# Patient Record
Sex: Female | Born: 1985 | Race: Black or African American | Hispanic: No | Marital: Single | Smoking: Never smoker
Health system: Southern US, Community
[De-identification: ages and names within clinical notes are randomized; demographics above are authoritative.]

---

## 2019-09-27 ENCOUNTER — Emergency Department (HOSPITAL_COMMUNITY)

## 2019-09-27 ENCOUNTER — Emergency Department (HOSPITAL_COMMUNITY)
Admission: EM | Admit: 2019-09-27 | Discharge: 2019-09-27 | Disposition: A | Discharge status: Home / Self Care | Attending: Emergency Medicine | Admitting: Emergency Medicine

## 2019-09-27 ENCOUNTER — Other Ambulatory Visit: Payer: Self-pay

## 2019-09-27 ENCOUNTER — Encounter (HOSPITAL_COMMUNITY): Payer: Self-pay | Admitting: Emergency Medicine

## 2019-09-27 DIAGNOSIS — M791 Myalgia, unspecified site: Secondary | ICD-10-CM

## 2019-09-27 DIAGNOSIS — R519 Headache, unspecified: Secondary | ICD-10-CM | POA: Insufficient documentation

## 2019-09-27 DIAGNOSIS — Y999 Unspecified external cause status: Secondary | ICD-10-CM | POA: Diagnosis not present

## 2019-09-27 DIAGNOSIS — R0789 Other chest pain: Secondary | ICD-10-CM | POA: Diagnosis not present

## 2019-09-27 DIAGNOSIS — Y92410 Unspecified street and highway as the place of occurrence of the external cause: Secondary | ICD-10-CM | POA: Diagnosis not present

## 2019-09-27 DIAGNOSIS — M7918 Myalgia, other site: Secondary | ICD-10-CM | POA: Diagnosis not present

## 2019-09-27 DIAGNOSIS — Z79899 Other long term (current) drug therapy: Secondary | ICD-10-CM | POA: Insufficient documentation

## 2019-09-27 DIAGNOSIS — Y939 Activity, unspecified: Secondary | ICD-10-CM | POA: Insufficient documentation

## 2019-09-27 LAB — CBC WITH DIFFERENTIAL/PLATELET
Abs Immature Granulocytes: 0.04 10*3/uL (ref 0.00–0.07)
Basophils Absolute: 0 10*3/uL (ref 0.0–0.1)
Basophils Relative: 0 %
Eosinophils Absolute: 0 10*3/uL (ref 0.0–0.5)
Eosinophils Relative: 0 %
HCT: 46.4 % — ABNORMAL HIGH (ref 36.0–46.0)
Hemoglobin: 14.4 g/dL (ref 12.0–15.0)
Immature Granulocytes: 1 %
Lymphocytes Relative: 45 %
Lymphs Abs: 3.7 10*3/uL (ref 0.7–4.0)
MCH: 26 pg (ref 26.0–34.0)
MCHC: 31 g/dL (ref 30.0–36.0)
MCV: 83.8 fL (ref 80.0–100.0)
Monocytes Absolute: 0.4 10*3/uL (ref 0.1–1.0)
Monocytes Relative: 5 %
Neutro Abs: 4.1 10*3/uL (ref 1.7–7.7)
Neutrophils Relative %: 49 %
Platelets: 393 10*3/uL (ref 150–400)
RBC: 5.54 MIL/uL — ABNORMAL HIGH (ref 3.87–5.11)
RDW: 15.2 % (ref 11.5–15.5)
WBC: 8.3 10*3/uL (ref 4.0–10.5)
nRBC: 0 % (ref 0.0–0.2)

## 2019-09-27 LAB — BASIC METABOLIC PANEL
Anion gap: 16 — ABNORMAL HIGH (ref 5–15)
BUN: 6 mg/dL (ref 6–20)
CO2: 21 mmol/L — ABNORMAL LOW (ref 22–32)
Calcium: 9.7 mg/dL (ref 8.9–10.3)
Chloride: 104 mmol/L (ref 98–111)
Creatinine, Ser: 1.02 mg/dL — ABNORMAL HIGH (ref 0.44–1.00)
GFR calc Af Amer: >60 mL/min (ref >60)
GFR calc non Af Amer: >60 mL/min (ref >60)
Glucose, Bld: 109 mg/dL — ABNORMAL HIGH (ref 70–99)
Potassium: 3.3 mmol/L — ABNORMAL LOW (ref 3.5–5.1)
Sodium: 141 mmol/L (ref 135–145)

## 2019-09-27 LAB — I-STAT BETA HCG BLOOD, ED (MC, WL, AP ONLY): I-stat hCG, quantitative: <5 m[IU]/mL (ref <5)

## 2019-09-27 LAB — ETHANOL: Alcohol, Ethyl (B): 242 mg/dL — ABNORMAL HIGH (ref <10)

## 2019-09-27 MED ORDER — IOHEXOL 300 MG/ML  SOLN
75.0000 mL | Freq: Once | INTRAMUSCULAR | Status: AC | PRN
  Administered 2019-09-27: 10:00:00 75 mL via INTRAVENOUS

## 2019-09-27 MED ORDER — METHOCARBAMOL 500 MG PO TABS: 500.0000 mg | ORAL_TABLET | Freq: Two times a day (BID) | ORAL | 0 refills | Status: AC | PRN

## 2019-09-27 MED ORDER — NAPROXEN 500 MG PO TABS: 500.0000 mg | ORAL_TABLET | Freq: Two times a day (BID) | ORAL | 0 refills | Status: AC | PRN

## 2019-09-27 NOTE — ED Notes (Signed)
Patient transported to X-ray 

## 2019-09-27 NOTE — Discharge Instructions (Addendum)
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
## 2019-09-27 NOTE — ED Notes (Signed)
Pt verbalized understanding of discharge instructions and follow up care. Pt ambulatory to lobby with steady gait. Alert and oriented X4. IV removed and bleeding controlled.
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
## 2020-07-17 IMAGING — DX DG HAND COMPLETE 3+V*L*
3 series · 3 of 3 positions shown · non-contrast
Comparison: None.

CLINICAL DATA: Pain

EXAM:
LEFT HAND - COMPLETE 3+ VIEW

[hand pa]
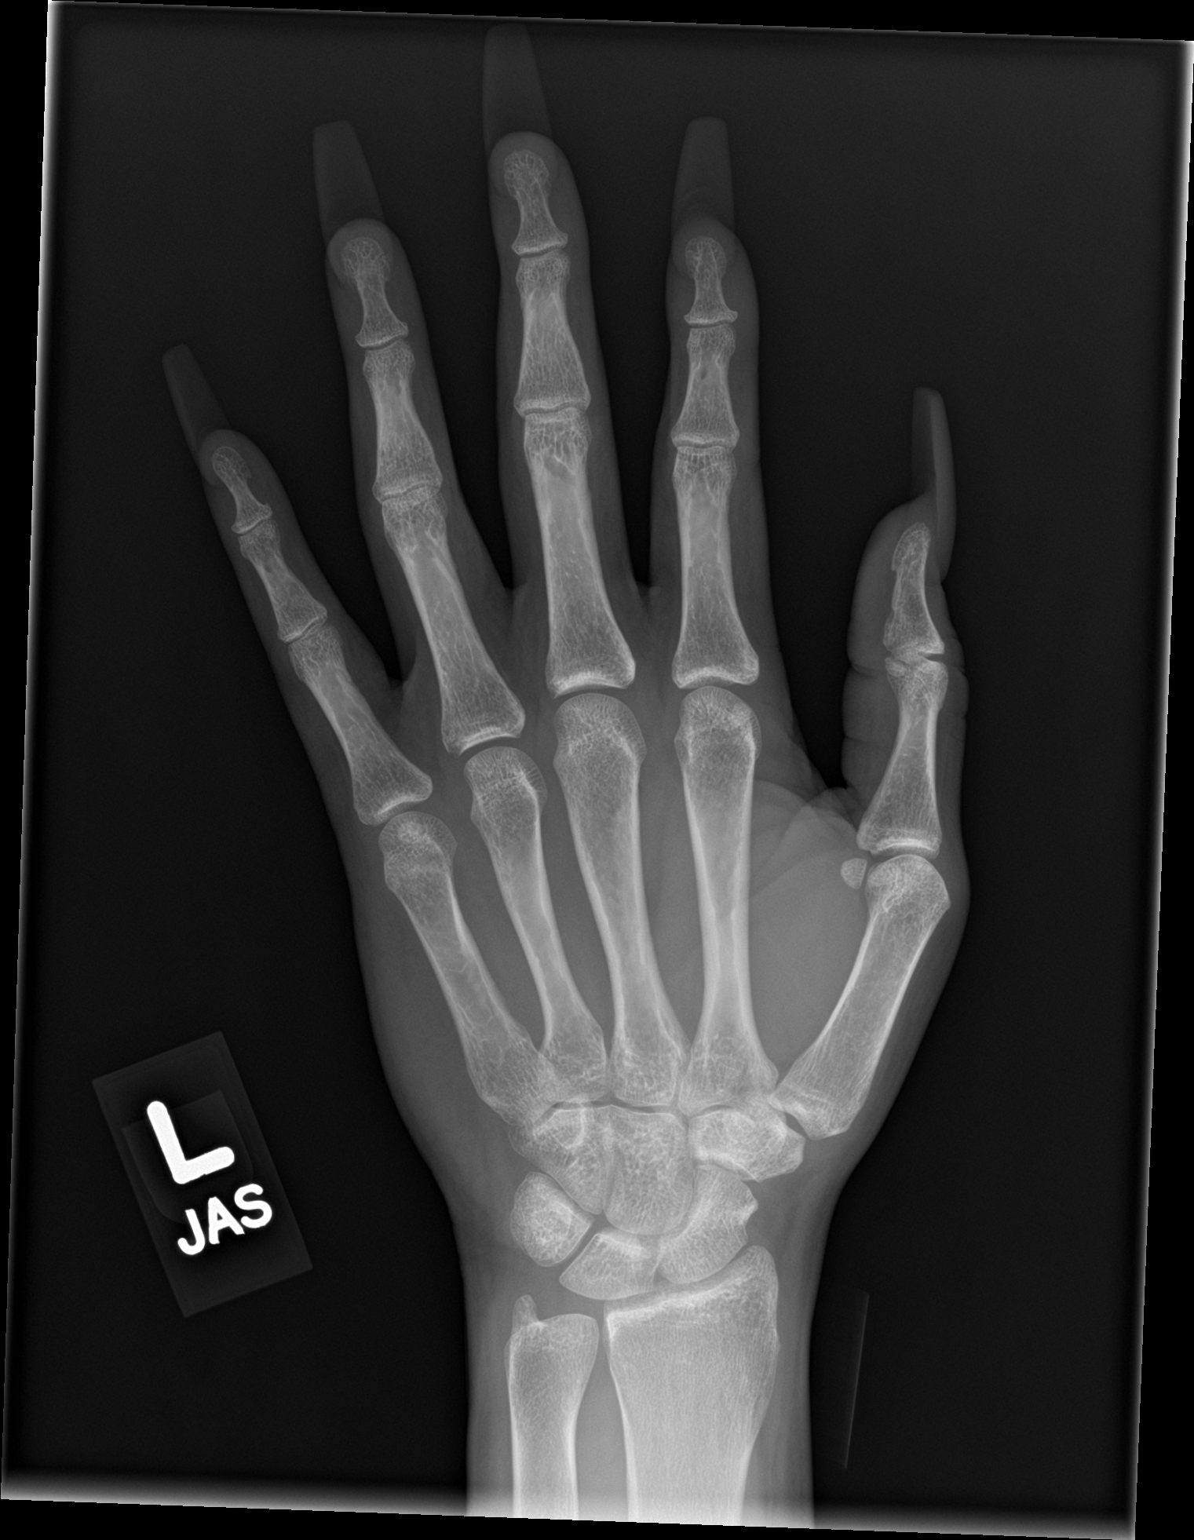

[hand obl]
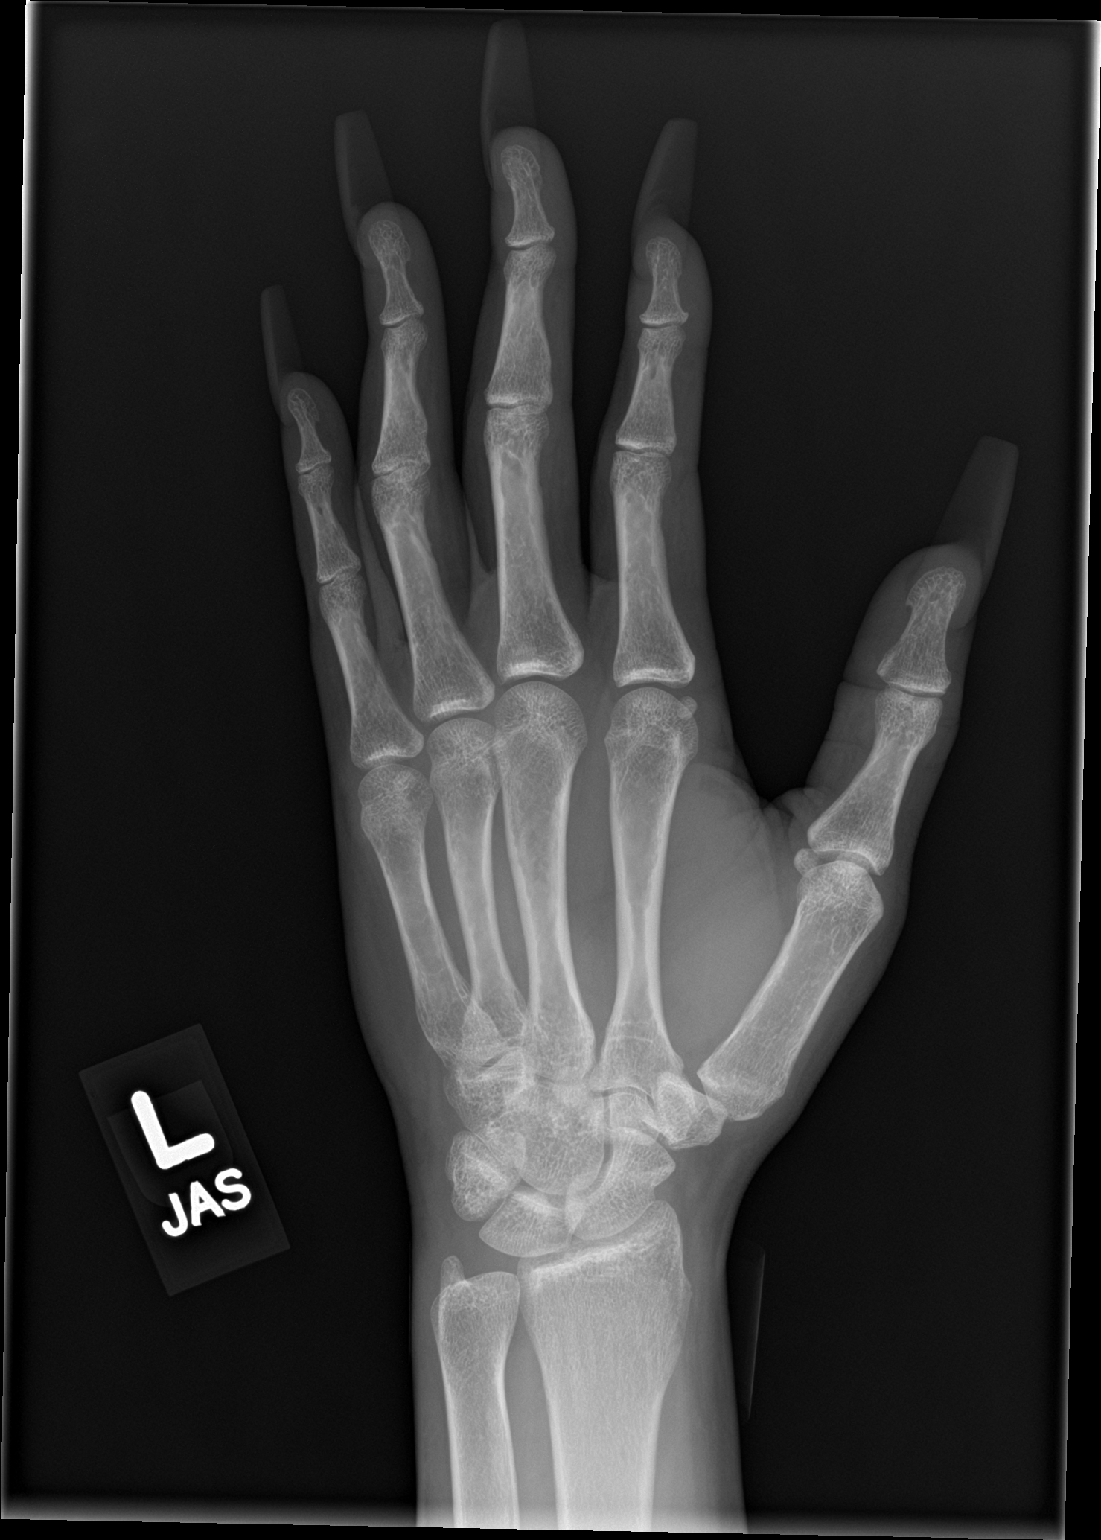

[hand lat]
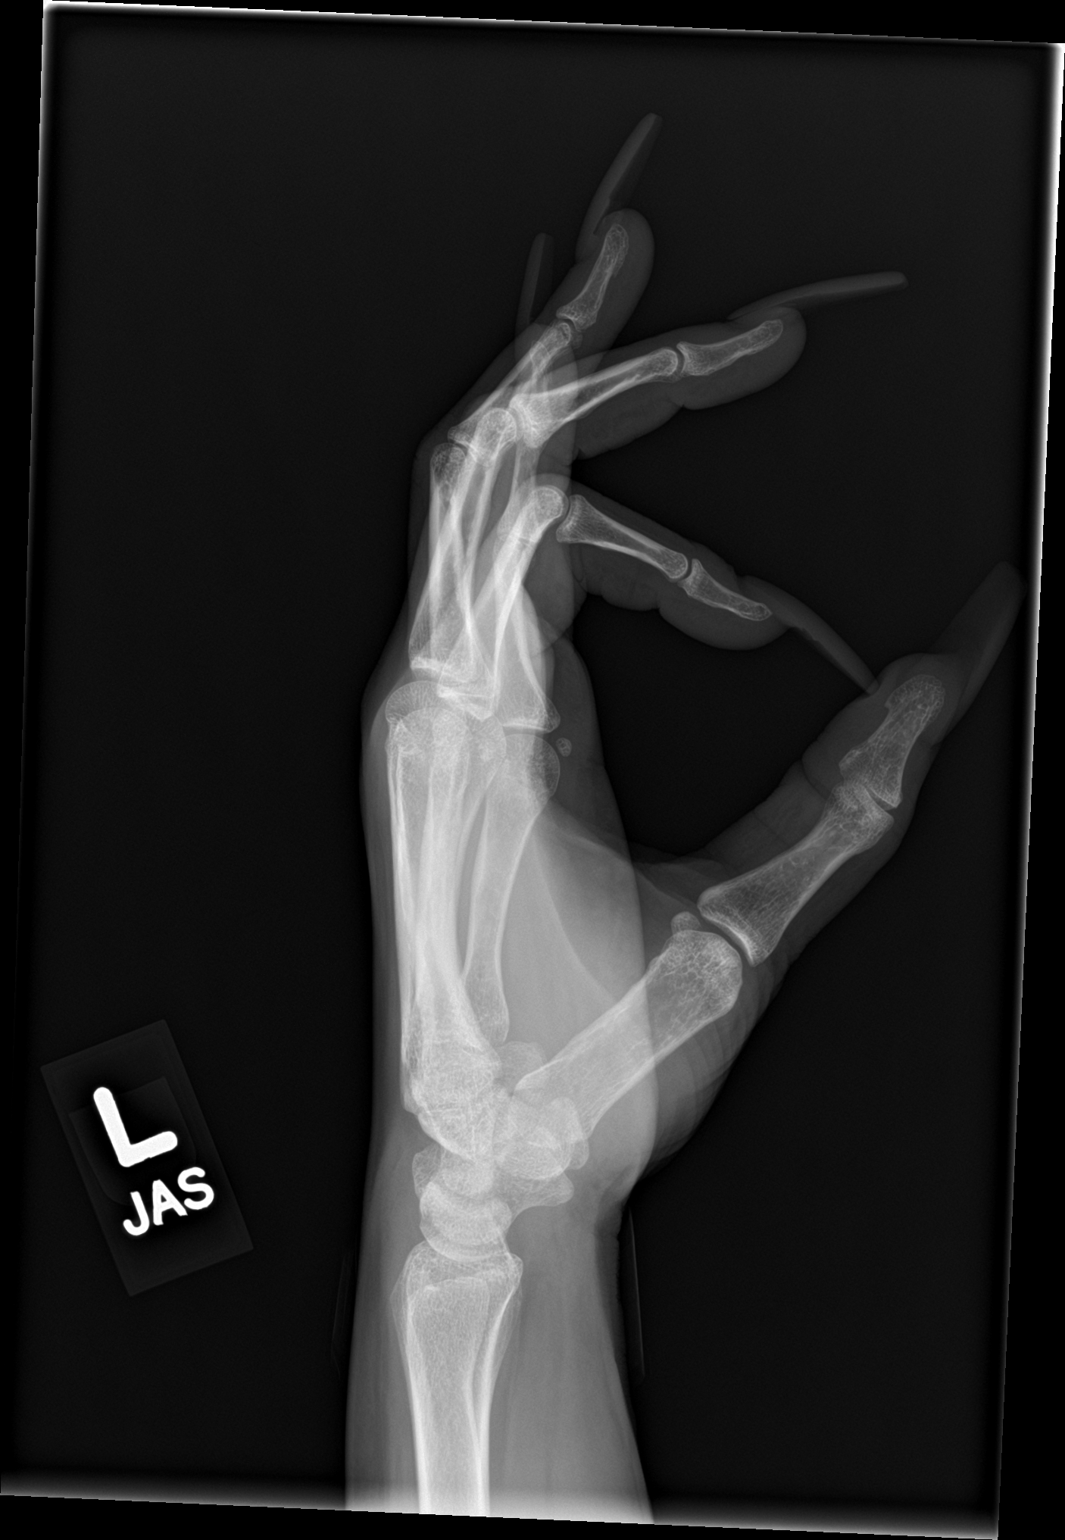

[3 of 3 positions shown; findings below may reference images not displayed]

FINDINGS: There is no evidence of fracture or dislocation. There is no
evidence of arthropathy or other focal bone abnormality. Soft
tissues are unremarkable.
IMPRESSION: Negative.
